# Patient Record
Sex: Male | Born: 1970 | Race: White | Hispanic: No | Marital: Married | Smoking: Never smoker
Health system: Southern US, Community
[De-identification: ages and names within clinical notes are randomized; demographics above are authoritative.]

---

## 2009-03-05 ENCOUNTER — Emergency Department: Payer: Self-pay | Admitting: Emergency Medicine

## 2012-02-06 ENCOUNTER — Ambulatory Visit: Payer: Self-pay | Admitting: Internal Medicine

## 2013-09-30 IMAGING — CR RIGHT INDEX FINGER 2+V
1 series · 3 of 3 positions shown · non-contrast
Comparison: none

REASON FOR EXAM: injury to rt index finger
COMMENTS:

[Series 1: pa · 0.17mm/px · 3 of 3 slices shown]
[im 1/3]
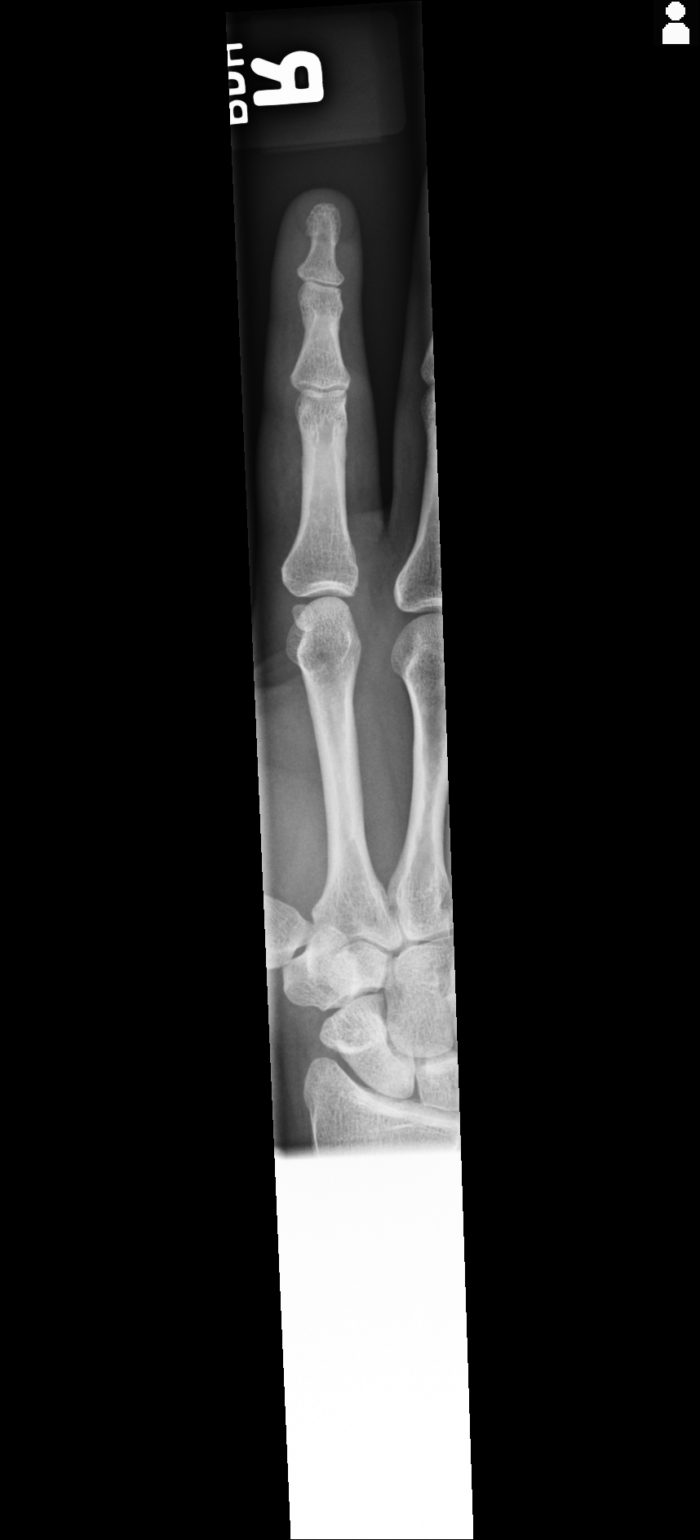
[im 2/3]
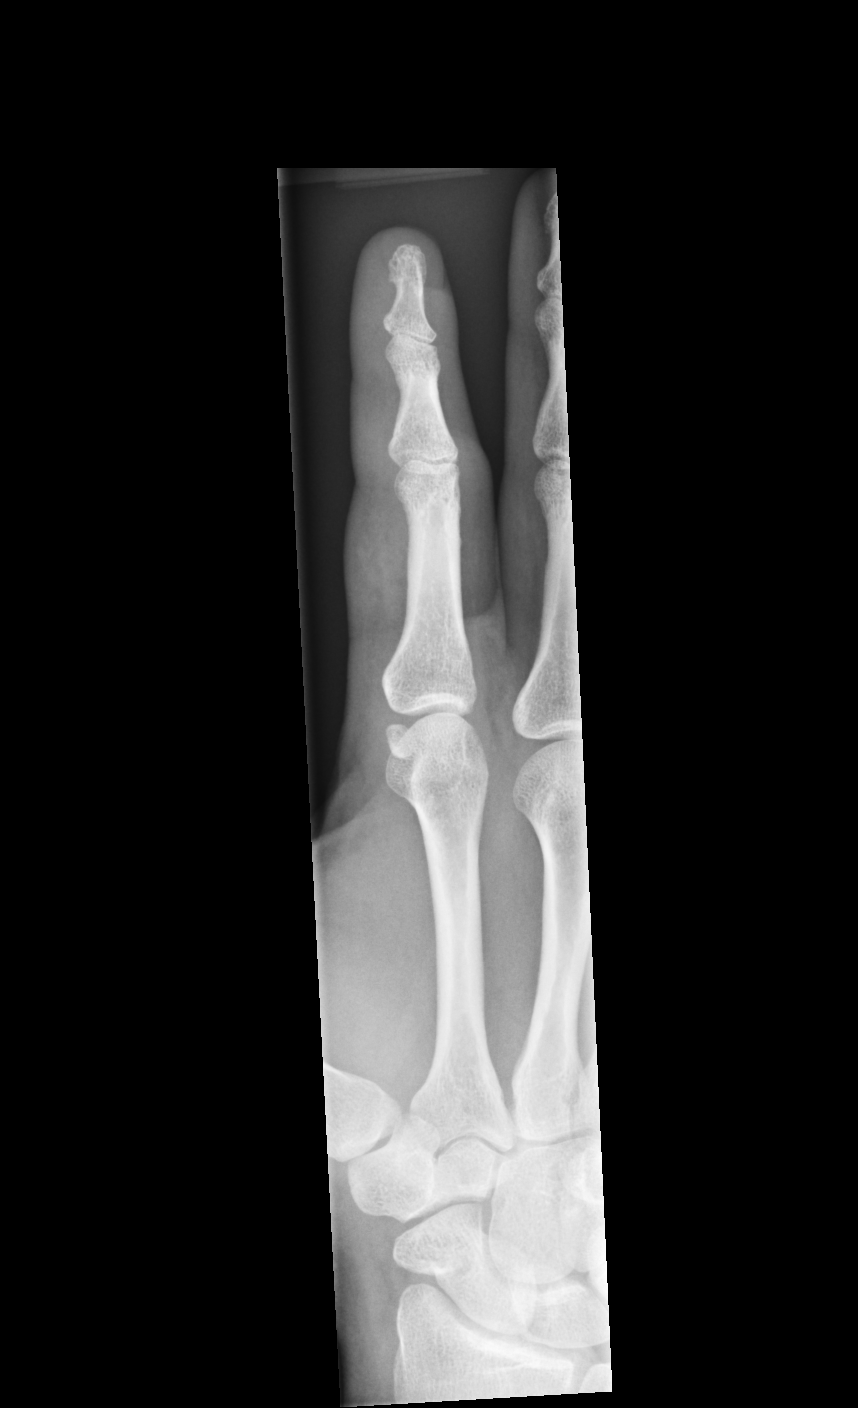
[im 3/3]
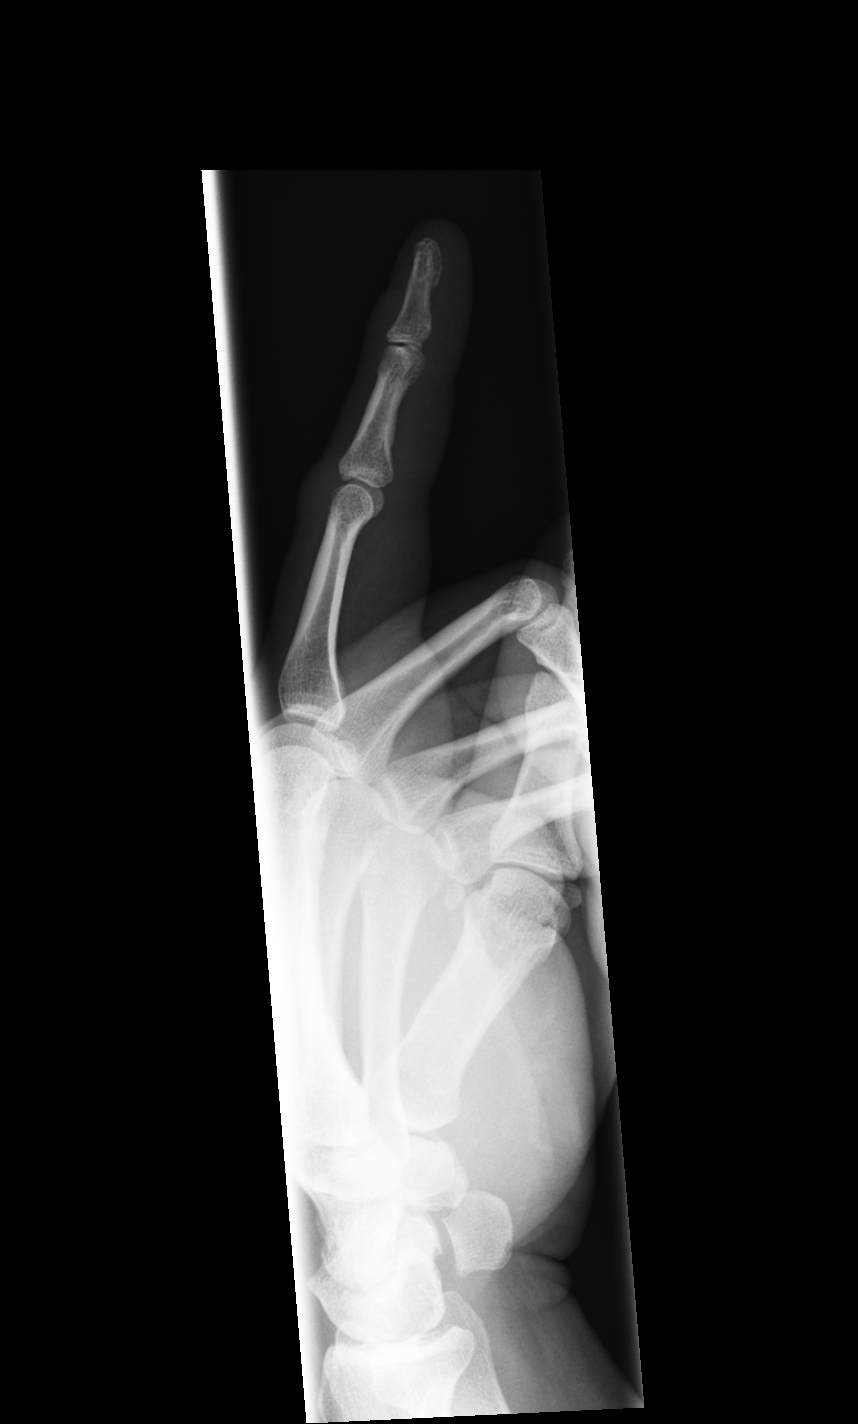

[3 of 3 positions shown; findings below may reference images not displayed]

PROCEDURE:     DXR - DXR FINGER INDEX 2ND DIGIT RT HA  - February 06, 2012  [DATE]

RESULT:     Three views of the right index finger are submitted. The bones
appear adequately mineralized. I do not see evidence of an avulsion fracture
or other fracture. The interphalangeal joints appear preserved. There is
mild soft tissue swelling over the PIP joint.
IMPRESSION: I see no acute bony abnormality of the right index finger.
Followup imaging is recommended if patient's symptoms do not resolve in a
fashion consistent with an uncomplicated sprain or contusion.

[REDACTED]

## 2015-03-26 ENCOUNTER — Emergency Department: Payer: Federal, State, Local not specified - PPO

## 2015-03-26 ENCOUNTER — Emergency Department
Admission: EM | Admit: 2015-03-26 | Discharge: 2015-03-26 | Disposition: A | Discharge status: Home / Self Care | Payer: Federal, State, Local not specified - PPO | Attending: Emergency Medicine | Admitting: Emergency Medicine

## 2015-03-26 ENCOUNTER — Encounter: Payer: Self-pay | Admitting: Emergency Medicine

## 2015-03-26 DIAGNOSIS — Y998 Other external cause status: Secondary | ICD-10-CM | POA: Insufficient documentation

## 2015-03-26 DIAGNOSIS — S39012A Strain of muscle, fascia and tendon of lower back, initial encounter: Secondary | ICD-10-CM | POA: Diagnosis not present

## 2015-03-26 DIAGNOSIS — W228XXA Striking against or struck by other objects, initial encounter: Secondary | ICD-10-CM | POA: Insufficient documentation

## 2015-03-26 DIAGNOSIS — T1490XA Injury, unspecified, initial encounter: Secondary | ICD-10-CM

## 2015-03-26 DIAGNOSIS — Y92830 Public park as the place of occurrence of the external cause: Secondary | ICD-10-CM | POA: Insufficient documentation

## 2015-03-26 DIAGNOSIS — Y9344 Activity, trampolining: Secondary | ICD-10-CM | POA: Diagnosis not present

## 2015-03-26 DIAGNOSIS — S3992XA Unspecified injury of lower back, initial encounter: Secondary | ICD-10-CM | POA: Diagnosis present

## 2015-03-26 MED ORDER — HYDROCODONE-ACETAMINOPHEN 5-325 MG PO TABS
1.0000 | ORAL_TABLET | Freq: Once | ORAL | Status: AC
  Administered 2015-03-26: 1 via ORAL
  Filled 2015-03-26: qty 1

## 2015-03-26 MED ORDER — CYCLOBENZAPRINE HCL 10 MG PO TABS: 10.0000 mg | ORAL_TABLET | Freq: Three times a day (TID) | ORAL | Status: DC | PRN

## 2015-03-26 MED ORDER — NAPROXEN 500 MG PO TABS
500.0000 mg | ORAL_TABLET | Freq: Two times a day (BID) | ORAL | Status: AC
Start: 2015-03-26 — End: 2016-03-25

## 2015-03-26 NOTE — ED Provider Notes (Signed)
[facility] Emergency Department Provider Note ____________________________________________  Time seen: Approximately 9:14 PM  I have reviewed the triage vital signs and the nursing notes.   HISTORY  Chief Complaint Back Pain   HPI John Bowers is a 44 y.o. male who presents to the emergency department for evaluation of right lower back pain after an injury he received at the trampoline park today. He states that he missed the next trampoline and hit his back on the padded bar. He has taken Aleve with some relief. He denies hematuria since the incident. He denies other injury.Injury occurred at approximately 4 PM.   History reviewed. No pertinent past medical history.  There are no active problems to display for this patient.   History reviewed. No pertinent past surgical history.  Current Outpatient Rx  Name  Route  Sig  Dispense  Refill  . cyclobenzaprine (FLEXERIL) 10 MG tablet   Oral   Take 1 tablet (10 mg total) by mouth 3 (three) times daily as needed for muscle spasms.   30 tablet   0   . naproxen (NAPROSYN) 500 MG tablet   Oral   Take 1 tablet (500 mg total) by mouth 2 (two) times daily with a meal.   60 tablet   2     Allergies Review of patient's allergies indicates no known allergies.  No family history on file.  Social History History  Substance Use Topics  . Smoking status: Never Smoker   . Smokeless tobacco: Not on file  . Alcohol Use: No    Review of Systems Constitutional: No recent illness. Eyes: No visual changes. ENT: No sore throat. Cardiovascular: Denies chest pain or palpitations. Respiratory: Denies shortness of breath. Gastrointestinal: No abdominal pain.  Genitourinary: Negative for dysuria. Musculoskeletal: Pain in right lower back. Skin: Negative for rash. Neurological: Negative for headaches, focal weakness or numbness. 10-point ROS otherwise  negative.  ____________________________________________   PHYSICAL EXAM:  VITAL SIGNS: ED Triage Vitals  Enc Vitals Group     BP 03/26/15 1931 142/91 mmHg     Pulse Rate 03/26/15 1931 92     Resp 03/26/15 1931 20     Temp 03/26/15 1931 98.2 F (36.8 C)     Temp Source 03/26/15 1931 Oral     SpO2 03/26/15 1931 98 %     Weight 03/26/15 1931 195 lb (88.451 kg)     Height 03/26/15 1931 5\' 10"  (1.778 m)     Head Cir --      Peak Flow --      Pain Score 03/26/15 1931 5     Pain Loc --      Pain Edu? --      Excl. in GC? --     Constitutional: Alert and oriented. Well appearing and in no acute distress. Eyes: Conjunctivae are normal. EOMI. Head: Atraumatic. Nose: No congestion/rhinnorhea. Neck: No stridor.  Respiratory: Normal respiratory effort.   Musculoskeletal: Midline tenderness over the lower lumbar spine with paraspinal tenderness over the right side. Neurologic:  Normal speech and language. No gross focal neurologic deficits are appreciated. Speech is normal. No gait instability. Skin:  Skin is warm, dry and intact. Atraumatic. No contusion noted Psychiatric: Mood and affect are normal. Speech and behavior are normal.  ____________________________________________   LABS (all labs ordered are listed, but only abnormal results are displayed)  Labs Reviewed - No data to display ____________________________________________  RADIOLOGY  Negative for acute bony abnormality. ____________________________________________   PROCEDURES  Procedure(s)  performed: None   ____________________________________________   INITIAL IMPRESSION / ASSESSMENT AND PLAN / ED COURSE  Pertinent labs & imaging results that were available during my care of the patient were reviewed by me and considered in my medical decision making (see chart for details).  Patient was advised to follow-up with orthopedics for symptoms that are not improving over the next week. He was advised to use  ice and elevate his legs while at home. He was advised to take the medications as prescribed. He was advised to return to the emergency department for symptoms that change or worsen if he is unable to schedule an appointment with orthopedics or his primary care provider. ____________________________________________   FINAL CLINICAL IMPRESSION(S) / ED DIAGNOSES  Final diagnoses:  Injury  Lumbosacral strain, initial encounter      Chinita Pester, FNP 03/26/15 2200  Minna Antis, MD 03/26/15 2229

## 2015-03-26 NOTE — Discharge Instructions (Signed)
Follow-up with the primary care provider or orthopedic specialist for symptoms that aren't not improving over the week. Return to the emergency department for symptoms that change or worsen if he is unable to schedule an appointment. Use ice on the sore areas and elevate the lower extremities.

## 2015-03-26 NOTE — ED Notes (Signed)
Patient ambulatory to triage with steady gait, without difficulty or distress noted; pt reports injuring lower back at trampoline park today at 4pm

## 2016-01-07 DIAGNOSIS — K080 Exfoliation of teeth due to systemic causes: Secondary | ICD-10-CM | POA: Diagnosis not present

## 2016-02-21 DIAGNOSIS — Z13220 Encounter for screening for lipoid disorders: Secondary | ICD-10-CM | POA: Diagnosis not present

## 2016-02-21 DIAGNOSIS — Z0000 Encounter for general adult medical examination without abnormal findings: Secondary | ICD-10-CM | POA: Diagnosis not present

## 2016-06-24 DIAGNOSIS — D2261 Melanocytic nevi of right upper limb, including shoulder: Secondary | ICD-10-CM | POA: Diagnosis not present

## 2016-06-24 DIAGNOSIS — L719 Rosacea, unspecified: Secondary | ICD-10-CM | POA: Diagnosis not present

## 2016-06-24 DIAGNOSIS — D485 Neoplasm of uncertain behavior of skin: Secondary | ICD-10-CM | POA: Diagnosis not present

## 2016-06-24 DIAGNOSIS — Z85828 Personal history of other malignant neoplasm of skin: Secondary | ICD-10-CM | POA: Diagnosis not present

## 2016-06-24 DIAGNOSIS — D225 Melanocytic nevi of trunk: Secondary | ICD-10-CM | POA: Diagnosis not present

## 2016-06-24 DIAGNOSIS — D2262 Melanocytic nevi of left upper limb, including shoulder: Secondary | ICD-10-CM | POA: Diagnosis not present

## 2016-09-16 DIAGNOSIS — K080 Exfoliation of teeth due to systemic causes: Secondary | ICD-10-CM | POA: Diagnosis not present

## 2016-09-23 DIAGNOSIS — K080 Exfoliation of teeth due to systemic causes: Secondary | ICD-10-CM | POA: Diagnosis not present

## 2016-11-17 IMAGING — CR DG LUMBAR SPINE 2-3V
1 series · 3 of 3 positions shown · non-contrast
Comparison: None.

CLINICAL DATA: Status post fall off trampoline, with right lower
back pain. Initial encounter.

EXAM:
LUMBAR SPINE - 2-3 VIEW

[Series 1: dg lumbar spine 2-3 views · 0.14mm/px · 3 of 3 slices shown]
[im 1/3]
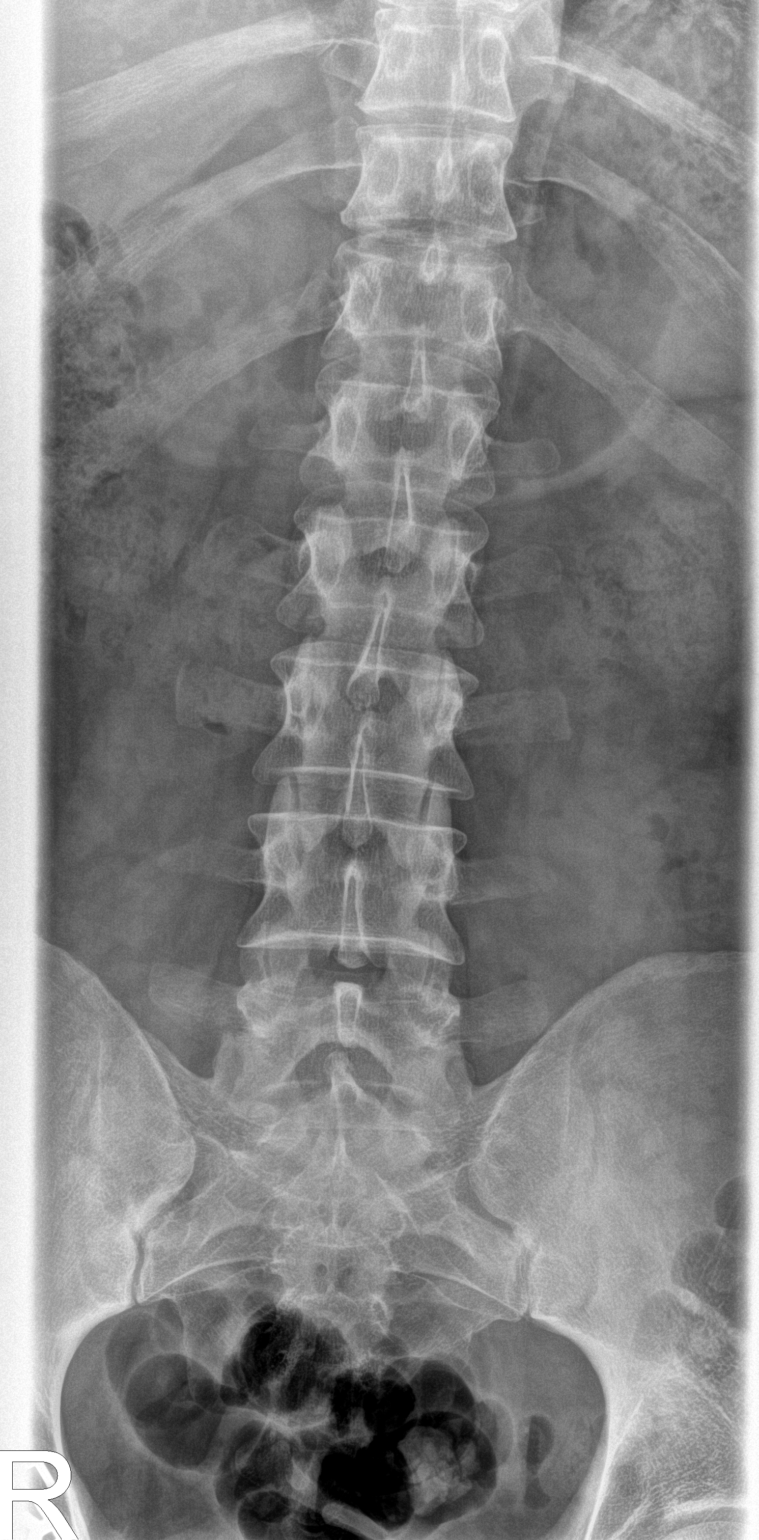
[im 2/3]
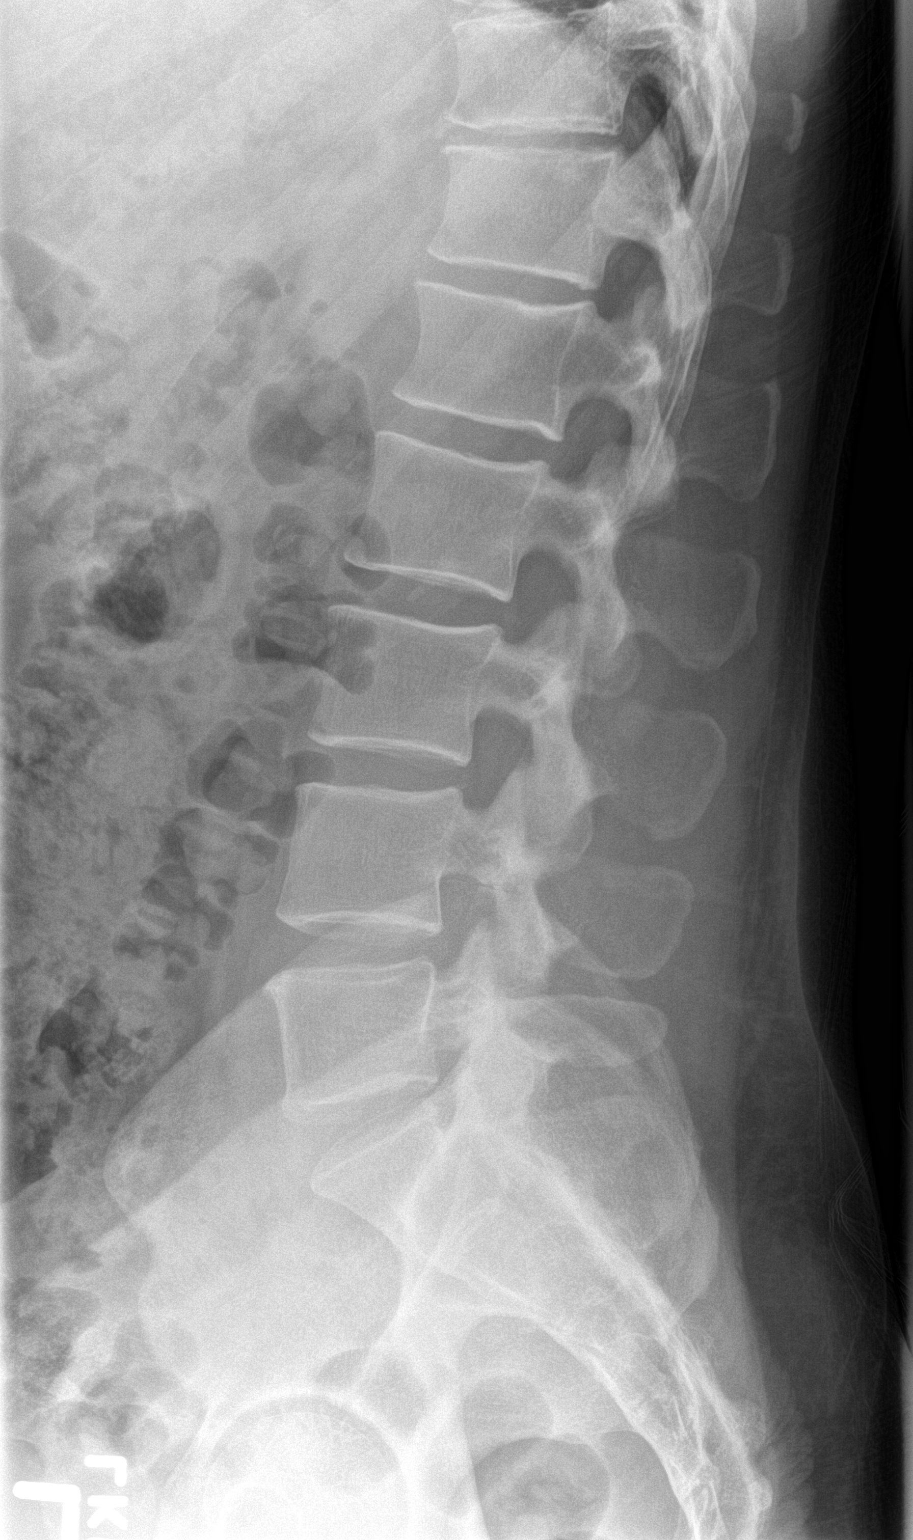
[im 3/3]
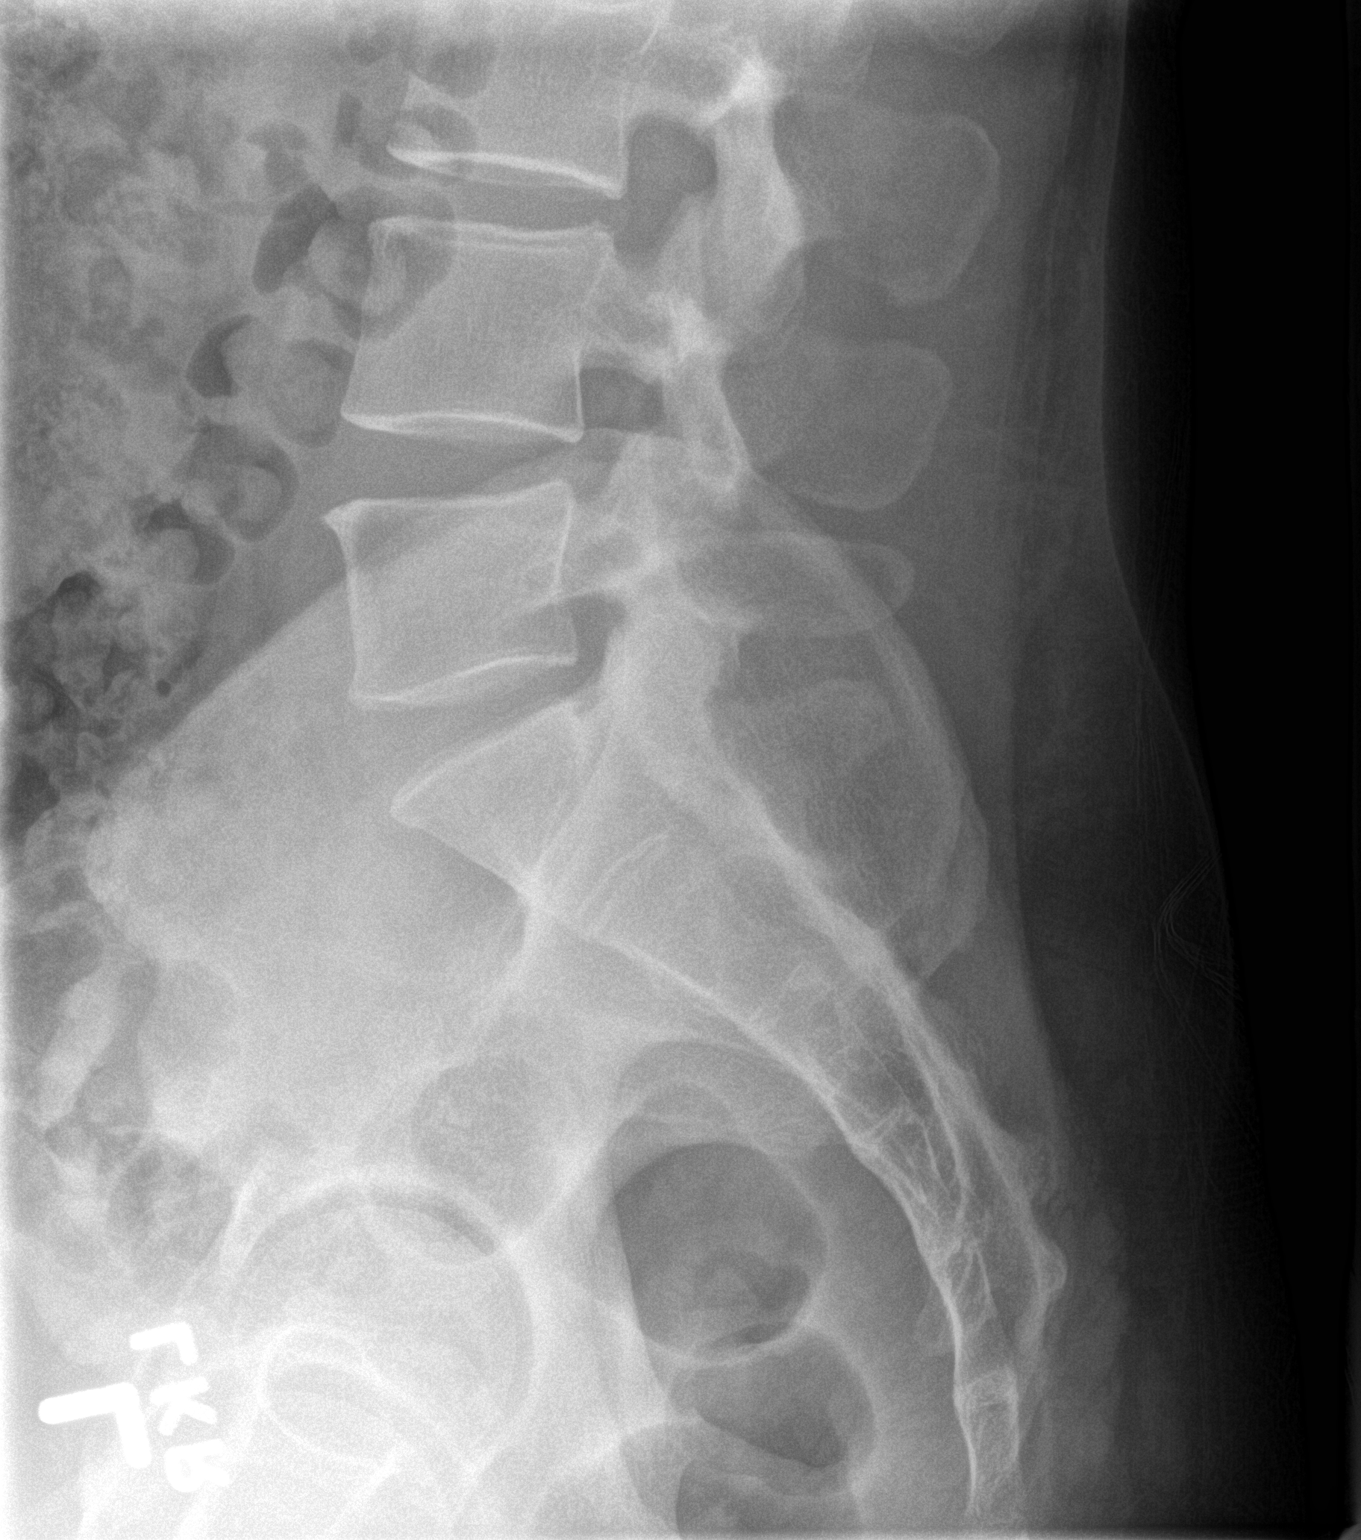

[3 of 3 positions shown; findings below may reference images not displayed]

FINDINGS: There is no evidence of fracture or subluxation. Vertebral bodies
demonstrate normal height and alignment. Intervertebral disc spaces
are preserved. The visualized neural foramina are grossly
unremarkable in appearance.

The visualized bowel gas pattern is unremarkable in appearance; air
and stool are noted within the colon. The sacroiliac joints are
within normal limits.
IMPRESSION: No evidence of fracture or subluxation along the lumbar spine.

## 2017-03-17 DIAGNOSIS — Z13220 Encounter for screening for lipoid disorders: Secondary | ICD-10-CM | POA: Diagnosis not present

## 2017-03-17 DIAGNOSIS — Z0000 Encounter for general adult medical examination without abnormal findings: Secondary | ICD-10-CM | POA: Diagnosis not present

## 2017-12-26 DIAGNOSIS — S8991XA Unspecified injury of right lower leg, initial encounter: Secondary | ICD-10-CM | POA: Diagnosis not present

## 2017-12-26 DIAGNOSIS — S8011XA Contusion of right lower leg, initial encounter: Secondary | ICD-10-CM | POA: Diagnosis not present

## 2018-05-03 DIAGNOSIS — E7800 Pure hypercholesterolemia, unspecified: Secondary | ICD-10-CM | POA: Diagnosis not present

## 2018-05-04 DIAGNOSIS — Z23 Encounter for immunization: Secondary | ICD-10-CM | POA: Diagnosis not present

## 2018-05-04 DIAGNOSIS — Z0000 Encounter for general adult medical examination without abnormal findings: Secondary | ICD-10-CM | POA: Diagnosis not present

## 2018-09-21 DIAGNOSIS — L718 Other rosacea: Secondary | ICD-10-CM | POA: Diagnosis not present

## 2018-09-21 DIAGNOSIS — Z85828 Personal history of other malignant neoplasm of skin: Secondary | ICD-10-CM | POA: Diagnosis not present

## 2018-09-21 DIAGNOSIS — D2262 Melanocytic nevi of left upper limb, including shoulder: Secondary | ICD-10-CM | POA: Diagnosis not present

## 2018-09-21 DIAGNOSIS — D2261 Melanocytic nevi of right upper limb, including shoulder: Secondary | ICD-10-CM | POA: Diagnosis not present

## 2018-10-13 DIAGNOSIS — M722 Plantar fascial fibromatosis: Secondary | ICD-10-CM | POA: Diagnosis not present

## 2018-10-13 DIAGNOSIS — M7661 Achilles tendinitis, right leg: Secondary | ICD-10-CM | POA: Diagnosis not present

## 2018-10-13 DIAGNOSIS — M79671 Pain in right foot: Secondary | ICD-10-CM | POA: Diagnosis not present

## 2018-11-30 DIAGNOSIS — M9902 Segmental and somatic dysfunction of thoracic region: Secondary | ICD-10-CM | POA: Diagnosis not present

## 2018-11-30 DIAGNOSIS — M62830 Muscle spasm of back: Secondary | ICD-10-CM | POA: Diagnosis not present

## 2018-11-30 DIAGNOSIS — M9901 Segmental and somatic dysfunction of cervical region: Secondary | ICD-10-CM | POA: Diagnosis not present

## 2018-11-30 DIAGNOSIS — M5412 Radiculopathy, cervical region: Secondary | ICD-10-CM | POA: Diagnosis not present

## 2018-12-01 DIAGNOSIS — M9902 Segmental and somatic dysfunction of thoracic region: Secondary | ICD-10-CM | POA: Diagnosis not present

## 2018-12-01 DIAGNOSIS — M9901 Segmental and somatic dysfunction of cervical region: Secondary | ICD-10-CM | POA: Diagnosis not present

## 2018-12-01 DIAGNOSIS — M5412 Radiculopathy, cervical region: Secondary | ICD-10-CM | POA: Diagnosis not present

## 2018-12-01 DIAGNOSIS — M62830 Muscle spasm of back: Secondary | ICD-10-CM | POA: Diagnosis not present

## 2018-12-05 DIAGNOSIS — M9901 Segmental and somatic dysfunction of cervical region: Secondary | ICD-10-CM | POA: Diagnosis not present

## 2018-12-05 DIAGNOSIS — M9902 Segmental and somatic dysfunction of thoracic region: Secondary | ICD-10-CM | POA: Diagnosis not present

## 2018-12-05 DIAGNOSIS — M62830 Muscle spasm of back: Secondary | ICD-10-CM | POA: Diagnosis not present

## 2018-12-05 DIAGNOSIS — M5412 Radiculopathy, cervical region: Secondary | ICD-10-CM | POA: Diagnosis not present

## 2018-12-07 DIAGNOSIS — M9901 Segmental and somatic dysfunction of cervical region: Secondary | ICD-10-CM | POA: Diagnosis not present

## 2018-12-07 DIAGNOSIS — M62830 Muscle spasm of back: Secondary | ICD-10-CM | POA: Diagnosis not present

## 2018-12-07 DIAGNOSIS — M9902 Segmental and somatic dysfunction of thoracic region: Secondary | ICD-10-CM | POA: Diagnosis not present

## 2018-12-07 DIAGNOSIS — M5412 Radiculopathy, cervical region: Secondary | ICD-10-CM | POA: Diagnosis not present

## 2018-12-08 DIAGNOSIS — M9902 Segmental and somatic dysfunction of thoracic region: Secondary | ICD-10-CM | POA: Diagnosis not present

## 2018-12-08 DIAGNOSIS — M9901 Segmental and somatic dysfunction of cervical region: Secondary | ICD-10-CM | POA: Diagnosis not present

## 2018-12-08 DIAGNOSIS — M5412 Radiculopathy, cervical region: Secondary | ICD-10-CM | POA: Diagnosis not present

## 2018-12-08 DIAGNOSIS — M62830 Muscle spasm of back: Secondary | ICD-10-CM | POA: Diagnosis not present

## 2018-12-12 DIAGNOSIS — M62830 Muscle spasm of back: Secondary | ICD-10-CM | POA: Diagnosis not present

## 2018-12-12 DIAGNOSIS — M9902 Segmental and somatic dysfunction of thoracic region: Secondary | ICD-10-CM | POA: Diagnosis not present

## 2018-12-12 DIAGNOSIS — M5412 Radiculopathy, cervical region: Secondary | ICD-10-CM | POA: Diagnosis not present

## 2018-12-12 DIAGNOSIS — M9901 Segmental and somatic dysfunction of cervical region: Secondary | ICD-10-CM | POA: Diagnosis not present

## 2018-12-14 DIAGNOSIS — M9901 Segmental and somatic dysfunction of cervical region: Secondary | ICD-10-CM | POA: Diagnosis not present

## 2018-12-14 DIAGNOSIS — M9902 Segmental and somatic dysfunction of thoracic region: Secondary | ICD-10-CM | POA: Diagnosis not present

## 2018-12-14 DIAGNOSIS — M5412 Radiculopathy, cervical region: Secondary | ICD-10-CM | POA: Diagnosis not present

## 2018-12-14 DIAGNOSIS — M62830 Muscle spasm of back: Secondary | ICD-10-CM | POA: Diagnosis not present

## 2018-12-15 DIAGNOSIS — M62830 Muscle spasm of back: Secondary | ICD-10-CM | POA: Diagnosis not present

## 2018-12-15 DIAGNOSIS — M9902 Segmental and somatic dysfunction of thoracic region: Secondary | ICD-10-CM | POA: Diagnosis not present

## 2018-12-15 DIAGNOSIS — M9901 Segmental and somatic dysfunction of cervical region: Secondary | ICD-10-CM | POA: Diagnosis not present

## 2018-12-15 DIAGNOSIS — M5412 Radiculopathy, cervical region: Secondary | ICD-10-CM | POA: Diagnosis not present

## 2018-12-19 DIAGNOSIS — M62830 Muscle spasm of back: Secondary | ICD-10-CM | POA: Diagnosis not present

## 2018-12-19 DIAGNOSIS — M9902 Segmental and somatic dysfunction of thoracic region: Secondary | ICD-10-CM | POA: Diagnosis not present

## 2018-12-19 DIAGNOSIS — M9901 Segmental and somatic dysfunction of cervical region: Secondary | ICD-10-CM | POA: Diagnosis not present

## 2018-12-19 DIAGNOSIS — M5412 Radiculopathy, cervical region: Secondary | ICD-10-CM | POA: Diagnosis not present

## 2018-12-21 DIAGNOSIS — M5412 Radiculopathy, cervical region: Secondary | ICD-10-CM | POA: Diagnosis not present

## 2018-12-21 DIAGNOSIS — M9901 Segmental and somatic dysfunction of cervical region: Secondary | ICD-10-CM | POA: Diagnosis not present

## 2018-12-21 DIAGNOSIS — M9902 Segmental and somatic dysfunction of thoracic region: Secondary | ICD-10-CM | POA: Diagnosis not present

## 2018-12-21 DIAGNOSIS — M62830 Muscle spasm of back: Secondary | ICD-10-CM | POA: Diagnosis not present

## 2018-12-22 DIAGNOSIS — M9901 Segmental and somatic dysfunction of cervical region: Secondary | ICD-10-CM | POA: Diagnosis not present

## 2018-12-22 DIAGNOSIS — M5412 Radiculopathy, cervical region: Secondary | ICD-10-CM | POA: Diagnosis not present

## 2018-12-22 DIAGNOSIS — M9902 Segmental and somatic dysfunction of thoracic region: Secondary | ICD-10-CM | POA: Diagnosis not present

## 2018-12-22 DIAGNOSIS — M62830 Muscle spasm of back: Secondary | ICD-10-CM | POA: Diagnosis not present

## 2018-12-27 DIAGNOSIS — M9902 Segmental and somatic dysfunction of thoracic region: Secondary | ICD-10-CM | POA: Diagnosis not present

## 2018-12-27 DIAGNOSIS — M62830 Muscle spasm of back: Secondary | ICD-10-CM | POA: Diagnosis not present

## 2018-12-27 DIAGNOSIS — M9901 Segmental and somatic dysfunction of cervical region: Secondary | ICD-10-CM | POA: Diagnosis not present

## 2018-12-27 DIAGNOSIS — M5412 Radiculopathy, cervical region: Secondary | ICD-10-CM | POA: Diagnosis not present

## 2018-12-29 DIAGNOSIS — M9901 Segmental and somatic dysfunction of cervical region: Secondary | ICD-10-CM | POA: Diagnosis not present

## 2018-12-29 DIAGNOSIS — M62830 Muscle spasm of back: Secondary | ICD-10-CM | POA: Diagnosis not present

## 2018-12-29 DIAGNOSIS — M9902 Segmental and somatic dysfunction of thoracic region: Secondary | ICD-10-CM | POA: Diagnosis not present

## 2018-12-29 DIAGNOSIS — M5412 Radiculopathy, cervical region: Secondary | ICD-10-CM | POA: Diagnosis not present

## 2019-01-03 DIAGNOSIS — M62830 Muscle spasm of back: Secondary | ICD-10-CM | POA: Diagnosis not present

## 2019-01-03 DIAGNOSIS — M9902 Segmental and somatic dysfunction of thoracic region: Secondary | ICD-10-CM | POA: Diagnosis not present

## 2019-01-03 DIAGNOSIS — M9901 Segmental and somatic dysfunction of cervical region: Secondary | ICD-10-CM | POA: Diagnosis not present

## 2019-01-03 DIAGNOSIS — M5412 Radiculopathy, cervical region: Secondary | ICD-10-CM | POA: Diagnosis not present

## 2019-01-05 DIAGNOSIS — M62830 Muscle spasm of back: Secondary | ICD-10-CM | POA: Diagnosis not present

## 2019-01-05 DIAGNOSIS — M5412 Radiculopathy, cervical region: Secondary | ICD-10-CM | POA: Diagnosis not present

## 2019-01-05 DIAGNOSIS — M9901 Segmental and somatic dysfunction of cervical region: Secondary | ICD-10-CM | POA: Diagnosis not present

## 2019-01-05 DIAGNOSIS — M9902 Segmental and somatic dysfunction of thoracic region: Secondary | ICD-10-CM | POA: Diagnosis not present

## 2019-01-10 DIAGNOSIS — M62830 Muscle spasm of back: Secondary | ICD-10-CM | POA: Diagnosis not present

## 2019-01-10 DIAGNOSIS — M9902 Segmental and somatic dysfunction of thoracic region: Secondary | ICD-10-CM | POA: Diagnosis not present

## 2019-01-10 DIAGNOSIS — M9901 Segmental and somatic dysfunction of cervical region: Secondary | ICD-10-CM | POA: Diagnosis not present

## 2019-01-10 DIAGNOSIS — M5412 Radiculopathy, cervical region: Secondary | ICD-10-CM | POA: Diagnosis not present

## 2019-01-12 DIAGNOSIS — M9902 Segmental and somatic dysfunction of thoracic region: Secondary | ICD-10-CM | POA: Diagnosis not present

## 2019-01-12 DIAGNOSIS — M62830 Muscle spasm of back: Secondary | ICD-10-CM | POA: Diagnosis not present

## 2019-01-12 DIAGNOSIS — M9901 Segmental and somatic dysfunction of cervical region: Secondary | ICD-10-CM | POA: Diagnosis not present

## 2019-01-12 DIAGNOSIS — M5412 Radiculopathy, cervical region: Secondary | ICD-10-CM | POA: Diagnosis not present

## 2019-01-18 DIAGNOSIS — M5412 Radiculopathy, cervical region: Secondary | ICD-10-CM | POA: Diagnosis not present

## 2019-01-18 DIAGNOSIS — M62830 Muscle spasm of back: Secondary | ICD-10-CM | POA: Diagnosis not present

## 2019-01-18 DIAGNOSIS — M9901 Segmental and somatic dysfunction of cervical region: Secondary | ICD-10-CM | POA: Diagnosis not present

## 2019-01-18 DIAGNOSIS — M9902 Segmental and somatic dysfunction of thoracic region: Secondary | ICD-10-CM | POA: Diagnosis not present

## 2019-01-25 DIAGNOSIS — M9901 Segmental and somatic dysfunction of cervical region: Secondary | ICD-10-CM | POA: Diagnosis not present

## 2019-01-25 DIAGNOSIS — M9902 Segmental and somatic dysfunction of thoracic region: Secondary | ICD-10-CM | POA: Diagnosis not present

## 2019-01-25 DIAGNOSIS — M62830 Muscle spasm of back: Secondary | ICD-10-CM | POA: Diagnosis not present

## 2019-01-25 DIAGNOSIS — M5412 Radiculopathy, cervical region: Secondary | ICD-10-CM | POA: Diagnosis not present

## 2019-02-08 DIAGNOSIS — M5412 Radiculopathy, cervical region: Secondary | ICD-10-CM | POA: Diagnosis not present

## 2019-02-08 DIAGNOSIS — M9901 Segmental and somatic dysfunction of cervical region: Secondary | ICD-10-CM | POA: Diagnosis not present

## 2019-02-08 DIAGNOSIS — M62830 Muscle spasm of back: Secondary | ICD-10-CM | POA: Diagnosis not present

## 2019-02-08 DIAGNOSIS — M9902 Segmental and somatic dysfunction of thoracic region: Secondary | ICD-10-CM | POA: Diagnosis not present

## 2019-05-15 DIAGNOSIS — Z20828 Contact with and (suspected) exposure to other viral communicable diseases: Secondary | ICD-10-CM | POA: Diagnosis not present

## 2019-09-15 DIAGNOSIS — Z20828 Contact with and (suspected) exposure to other viral communicable diseases: Secondary | ICD-10-CM | POA: Diagnosis not present

## 2019-09-15 DIAGNOSIS — Z03818 Encounter for observation for suspected exposure to other biological agents ruled out: Secondary | ICD-10-CM | POA: Diagnosis not present

## 2019-09-21 DIAGNOSIS — Z20828 Contact with and (suspected) exposure to other viral communicable diseases: Secondary | ICD-10-CM | POA: Diagnosis not present

## 2019-10-11 DIAGNOSIS — B948 Sequelae of other specified infectious and parasitic diseases: Secondary | ICD-10-CM | POA: Diagnosis not present

## 2019-12-13 DIAGNOSIS — S62025A Nondisplaced fracture of middle third of navicular [scaphoid] bone of left wrist, initial encounter for closed fracture: Secondary | ICD-10-CM | POA: Diagnosis not present

## 2019-12-13 DIAGNOSIS — G8929 Other chronic pain: Secondary | ICD-10-CM | POA: Diagnosis not present

## 2019-12-13 DIAGNOSIS — M25532 Pain in left wrist: Secondary | ICD-10-CM | POA: Diagnosis not present

## 2020-01-01 DIAGNOSIS — M5412 Radiculopathy, cervical region: Secondary | ICD-10-CM | POA: Diagnosis not present

## 2020-01-01 DIAGNOSIS — M62830 Muscle spasm of back: Secondary | ICD-10-CM | POA: Diagnosis not present

## 2020-01-01 DIAGNOSIS — M9901 Segmental and somatic dysfunction of cervical region: Secondary | ICD-10-CM | POA: Diagnosis not present

## 2020-01-01 DIAGNOSIS — M9902 Segmental and somatic dysfunction of thoracic region: Secondary | ICD-10-CM | POA: Diagnosis not present

## 2020-05-14 DIAGNOSIS — M62830 Muscle spasm of back: Secondary | ICD-10-CM | POA: Diagnosis not present

## 2020-05-14 DIAGNOSIS — M9901 Segmental and somatic dysfunction of cervical region: Secondary | ICD-10-CM | POA: Diagnosis not present

## 2020-05-14 DIAGNOSIS — M9902 Segmental and somatic dysfunction of thoracic region: Secondary | ICD-10-CM | POA: Diagnosis not present

## 2020-05-14 DIAGNOSIS — M5412 Radiculopathy, cervical region: Secondary | ICD-10-CM | POA: Diagnosis not present

## 2020-05-23 DIAGNOSIS — M9901 Segmental and somatic dysfunction of cervical region: Secondary | ICD-10-CM | POA: Diagnosis not present

## 2020-05-23 DIAGNOSIS — M5412 Radiculopathy, cervical region: Secondary | ICD-10-CM | POA: Diagnosis not present

## 2020-05-23 DIAGNOSIS — M62830 Muscle spasm of back: Secondary | ICD-10-CM | POA: Diagnosis not present

## 2020-05-23 DIAGNOSIS — M9902 Segmental and somatic dysfunction of thoracic region: Secondary | ICD-10-CM | POA: Diagnosis not present

## 2020-05-28 DIAGNOSIS — M9902 Segmental and somatic dysfunction of thoracic region: Secondary | ICD-10-CM | POA: Diagnosis not present

## 2020-05-28 DIAGNOSIS — M62830 Muscle spasm of back: Secondary | ICD-10-CM | POA: Diagnosis not present

## 2020-05-28 DIAGNOSIS — M5412 Radiculopathy, cervical region: Secondary | ICD-10-CM | POA: Diagnosis not present

## 2020-05-28 DIAGNOSIS — M9901 Segmental and somatic dysfunction of cervical region: Secondary | ICD-10-CM | POA: Diagnosis not present

## 2020-06-03 DIAGNOSIS — M9901 Segmental and somatic dysfunction of cervical region: Secondary | ICD-10-CM | POA: Diagnosis not present

## 2020-06-03 DIAGNOSIS — M62830 Muscle spasm of back: Secondary | ICD-10-CM | POA: Diagnosis not present

## 2020-06-03 DIAGNOSIS — M5412 Radiculopathy, cervical region: Secondary | ICD-10-CM | POA: Diagnosis not present

## 2020-06-03 DIAGNOSIS — M9902 Segmental and somatic dysfunction of thoracic region: Secondary | ICD-10-CM | POA: Diagnosis not present

## 2020-06-11 DIAGNOSIS — M9902 Segmental and somatic dysfunction of thoracic region: Secondary | ICD-10-CM | POA: Diagnosis not present

## 2020-06-11 DIAGNOSIS — M5412 Radiculopathy, cervical region: Secondary | ICD-10-CM | POA: Diagnosis not present

## 2020-06-11 DIAGNOSIS — M62830 Muscle spasm of back: Secondary | ICD-10-CM | POA: Diagnosis not present

## 2020-06-11 DIAGNOSIS — M9901 Segmental and somatic dysfunction of cervical region: Secondary | ICD-10-CM | POA: Diagnosis not present

## 2020-06-26 DIAGNOSIS — M9901 Segmental and somatic dysfunction of cervical region: Secondary | ICD-10-CM | POA: Diagnosis not present

## 2020-06-26 DIAGNOSIS — M9902 Segmental and somatic dysfunction of thoracic region: Secondary | ICD-10-CM | POA: Diagnosis not present

## 2020-06-26 DIAGNOSIS — M5412 Radiculopathy, cervical region: Secondary | ICD-10-CM | POA: Diagnosis not present

## 2020-06-26 DIAGNOSIS — M62830 Muscle spasm of back: Secondary | ICD-10-CM | POA: Diagnosis not present

## 2020-07-10 DIAGNOSIS — M9901 Segmental and somatic dysfunction of cervical region: Secondary | ICD-10-CM | POA: Diagnosis not present

## 2020-07-10 DIAGNOSIS — M9902 Segmental and somatic dysfunction of thoracic region: Secondary | ICD-10-CM | POA: Diagnosis not present

## 2020-07-10 DIAGNOSIS — M5412 Radiculopathy, cervical region: Secondary | ICD-10-CM | POA: Diagnosis not present

## 2020-07-10 DIAGNOSIS — M62830 Muscle spasm of back: Secondary | ICD-10-CM | POA: Diagnosis not present

## 2020-08-07 DIAGNOSIS — M9901 Segmental and somatic dysfunction of cervical region: Secondary | ICD-10-CM | POA: Diagnosis not present

## 2020-08-07 DIAGNOSIS — M9902 Segmental and somatic dysfunction of thoracic region: Secondary | ICD-10-CM | POA: Diagnosis not present

## 2020-08-07 DIAGNOSIS — M5412 Radiculopathy, cervical region: Secondary | ICD-10-CM | POA: Diagnosis not present

## 2020-08-07 DIAGNOSIS — M62830 Muscle spasm of back: Secondary | ICD-10-CM | POA: Diagnosis not present

## 2021-02-12 DIAGNOSIS — E559 Vitamin D deficiency, unspecified: Secondary | ICD-10-CM | POA: Diagnosis not present

## 2021-02-12 DIAGNOSIS — Z13220 Encounter for screening for lipoid disorders: Secondary | ICD-10-CM | POA: Diagnosis not present

## 2021-02-12 DIAGNOSIS — Z0000 Encounter for general adult medical examination without abnormal findings: Secondary | ICD-10-CM | POA: Diagnosis not present

## 2021-02-13 DIAGNOSIS — M5412 Radiculopathy, cervical region: Secondary | ICD-10-CM | POA: Diagnosis not present

## 2021-02-13 DIAGNOSIS — M9902 Segmental and somatic dysfunction of thoracic region: Secondary | ICD-10-CM | POA: Diagnosis not present

## 2021-02-13 DIAGNOSIS — M9901 Segmental and somatic dysfunction of cervical region: Secondary | ICD-10-CM | POA: Diagnosis not present

## 2021-02-13 DIAGNOSIS — M62830 Muscle spasm of back: Secondary | ICD-10-CM | POA: Diagnosis not present

## 2021-02-18 DIAGNOSIS — M62830 Muscle spasm of back: Secondary | ICD-10-CM | POA: Diagnosis not present

## 2021-02-18 DIAGNOSIS — M9901 Segmental and somatic dysfunction of cervical region: Secondary | ICD-10-CM | POA: Diagnosis not present

## 2021-02-18 DIAGNOSIS — M5412 Radiculopathy, cervical region: Secondary | ICD-10-CM | POA: Diagnosis not present

## 2021-02-18 DIAGNOSIS — M9902 Segmental and somatic dysfunction of thoracic region: Secondary | ICD-10-CM | POA: Diagnosis not present

## 2021-02-27 DIAGNOSIS — M5412 Radiculopathy, cervical region: Secondary | ICD-10-CM | POA: Diagnosis not present

## 2021-02-27 DIAGNOSIS — M9902 Segmental and somatic dysfunction of thoracic region: Secondary | ICD-10-CM | POA: Diagnosis not present

## 2021-02-27 DIAGNOSIS — M62830 Muscle spasm of back: Secondary | ICD-10-CM | POA: Diagnosis not present

## 2021-02-27 DIAGNOSIS — M9901 Segmental and somatic dysfunction of cervical region: Secondary | ICD-10-CM | POA: Diagnosis not present

## 2021-07-03 DIAGNOSIS — M5412 Radiculopathy, cervical region: Secondary | ICD-10-CM | POA: Diagnosis not present

## 2021-07-03 DIAGNOSIS — M62830 Muscle spasm of back: Secondary | ICD-10-CM | POA: Diagnosis not present

## 2021-07-03 DIAGNOSIS — M9901 Segmental and somatic dysfunction of cervical region: Secondary | ICD-10-CM | POA: Diagnosis not present

## 2021-07-03 DIAGNOSIS — M9902 Segmental and somatic dysfunction of thoracic region: Secondary | ICD-10-CM | POA: Diagnosis not present

## 2021-07-10 DIAGNOSIS — M5412 Radiculopathy, cervical region: Secondary | ICD-10-CM | POA: Diagnosis not present

## 2021-07-10 DIAGNOSIS — M62830 Muscle spasm of back: Secondary | ICD-10-CM | POA: Diagnosis not present

## 2021-07-10 DIAGNOSIS — M9901 Segmental and somatic dysfunction of cervical region: Secondary | ICD-10-CM | POA: Diagnosis not present

## 2021-07-10 DIAGNOSIS — M9902 Segmental and somatic dysfunction of thoracic region: Secondary | ICD-10-CM | POA: Diagnosis not present

## 2021-07-16 DIAGNOSIS — M25511 Pain in right shoulder: Secondary | ICD-10-CM | POA: Diagnosis not present

## 2021-07-16 DIAGNOSIS — M9901 Segmental and somatic dysfunction of cervical region: Secondary | ICD-10-CM | POA: Diagnosis not present

## 2021-07-16 DIAGNOSIS — M9907 Segmental and somatic dysfunction of upper extremity: Secondary | ICD-10-CM | POA: Diagnosis not present

## 2021-07-16 DIAGNOSIS — M5412 Radiculopathy, cervical region: Secondary | ICD-10-CM | POA: Diagnosis not present

## 2021-07-22 DIAGNOSIS — M25511 Pain in right shoulder: Secondary | ICD-10-CM | POA: Diagnosis not present

## 2021-07-22 DIAGNOSIS — M9907 Segmental and somatic dysfunction of upper extremity: Secondary | ICD-10-CM | POA: Diagnosis not present

## 2021-07-22 DIAGNOSIS — M9901 Segmental and somatic dysfunction of cervical region: Secondary | ICD-10-CM | POA: Diagnosis not present

## 2021-07-22 DIAGNOSIS — M5412 Radiculopathy, cervical region: Secondary | ICD-10-CM | POA: Diagnosis not present

## 2021-07-24 DIAGNOSIS — M9907 Segmental and somatic dysfunction of upper extremity: Secondary | ICD-10-CM | POA: Diagnosis not present

## 2021-07-24 DIAGNOSIS — M5412 Radiculopathy, cervical region: Secondary | ICD-10-CM | POA: Diagnosis not present

## 2021-07-24 DIAGNOSIS — M25511 Pain in right shoulder: Secondary | ICD-10-CM | POA: Diagnosis not present

## 2021-07-24 DIAGNOSIS — M9901 Segmental and somatic dysfunction of cervical region: Secondary | ICD-10-CM | POA: Diagnosis not present

## 2021-10-07 DIAGNOSIS — K5730 Diverticulosis of large intestine without perforation or abscess without bleeding: Secondary | ICD-10-CM | POA: Diagnosis not present

## 2021-10-07 DIAGNOSIS — Z1211 Encounter for screening for malignant neoplasm of colon: Secondary | ICD-10-CM | POA: Diagnosis not present

## 2021-10-07 DIAGNOSIS — D122 Benign neoplasm of ascending colon: Secondary | ICD-10-CM | POA: Diagnosis not present

## 2021-10-07 DIAGNOSIS — D123 Benign neoplasm of transverse colon: Secondary | ICD-10-CM | POA: Diagnosis not present

## 2021-10-20 ENCOUNTER — Ambulatory Visit
Admission: EM | Admit: 2021-10-20 | Discharge: 2021-10-20 | Disposition: A | Discharge status: Home / Self Care | Payer: Federal, State, Local not specified - PPO | Attending: Internal Medicine | Admitting: Internal Medicine

## 2021-10-20 ENCOUNTER — Encounter: Payer: Self-pay | Admitting: Emergency Medicine

## 2021-10-20 ENCOUNTER — Other Ambulatory Visit: Payer: Self-pay

## 2021-10-20 DIAGNOSIS — J209 Acute bronchitis, unspecified: Secondary | ICD-10-CM

## 2021-10-20 MED ORDER — ALBUTEROL SULFATE (2.5 MG/3ML) 0.083% IN NEBU
2.5000 mg | INHALATION_SOLUTION | Freq: Once | RESPIRATORY_TRACT | Status: AC
  Administered 2021-10-20: 2.5 mg via RESPIRATORY_TRACT

## 2021-10-20 MED ORDER — AZITHROMYCIN 250 MG PO TABS: ORAL_TABLET | ORAL | 0 refills | Status: DC

## 2021-10-20 MED ORDER — ALBUTEROL SULFATE HFA 108 (90 BASE) MCG/ACT IN AERS: 2.0000 | INHALATION_SPRAY | RESPIRATORY_TRACT | 0 refills | Status: AC | PRN

## 2021-10-20 NOTE — ED Triage Notes (Signed)
Pt here with cough, congestion, and headache x 1 week.

## 2021-10-20 NOTE — ED Provider Notes (Signed)
John Bowers    CSN: [id number] Arrival date & time: 10/20/21  1002      History   Chief Complaint Chief Complaint  Patient presents with   Cough   Nasal Congestion   Headache    HPI John Bowers is a 51 y.o. male who presents with scratchy throat, rhinitis, sneezing, and cough one week ago. Denies a fever. Denies HA. His wife has diagnosed with bacterial bronchitis and placed on Amocicillin He has had a producgive cough and been wheezing. Denies being a smoker.     History reviewed. No pertinent past medical history.  There are no problems to display for this patient.   History reviewed. No pertinent surgical history.     Home Medications    Prior to Admission medications   Medication Sig Start Date End Date Taking? Authorizing Provider  cyclobenzaprine (FLEXERIL) 10 MG tablet Take 1 tablet (10 mg total) by mouth 3 (three) times daily as needed for muscle spasms. 03/26/15   Chinita Pester, FNP    Family History History reviewed. No pertinent family history.  Social History Social History   Tobacco Use   Smoking status: Never  Substance Use Topics   Alcohol use: No   Drug use: Never     Allergies   Patient has no known allergies.   Review of Systems Review of Systems  Constitutional:  Positive for fatigue. Negative for chills, diaphoresis and fever.  HENT:  Positive for congestion, postnasal drip and rhinorrhea. Negative for sore throat and trouble swallowing.   Eyes:  Negative for discharge.  Respiratory:  Positive for cough and wheezing. Negative for shortness of breath.   Musculoskeletal:  Negative for myalgias.  Skin:  Negative for rash.  Neurological:  Negative for headaches.    Physical Exam Triage Vital Signs ED Triage Vitals  Enc Vitals Group     BP 10/20/21 1014 (!) 151/80     Pulse Rate 10/20/21 1014 83     Resp 10/20/21 1014 20     Temp 10/20/21 1014 98.9 F (37.2 C)     Temp Source 10/20/21 1014 Oral     SpO2  10/20/21 1014 98 %     Weight --      Height --      Head Circumference --      Peak Flow --      Pain Score 10/20/21 1015 0     Pain Loc --      Pain Edu? --      Excl. in GC? --    No data found.  Updated Vital Signs BP (!) 151/80    Pulse 83    Temp 98.9 F (37.2 C) (Oral)    Resp 20    SpO2 98%   Visual Acuity Right Eye Distance:   Left Eye Distance:   Bilateral Distance:    Right Eye Near:   Left Eye Near:    Bilateral Near:     Physical Exam Physical Exam Constitutional:      General: He is not in acute distress.    Appearance: He is not toxic-appearing.  HENT:     Head: Normocephalic.     Right Ear: Tympanic membrane, ear canal and external ear normal.     Left Ear: Ear canal and external ear normal.     Nose: Nose normal.     Mouth/Throat:     Mouth: Mucous membranes are moist.     Pharynx: Oropharynx is clear.  Eyes:  General: No scleral icterus.    Conjunctiva/sclera: Conjunctivae normal.  Cardiovascular:     Rate and Rhythm: Normal rate and regular rhythm.     Heart sounds: No murmur heard.   Pulmonary:     Effort: Pulmonary effort is normal. No respiratory distress.     Breath sounds: Wheezing present all over     Comments: Has auditory wheezing Musculoskeletal:        General: Normal range of motion.     Cervical back: Neck supple.  Lymphadenopathy:     Cervical: No cervical adenopathy.  Skin:    General: Skin is warm and dry.     Findings: No rash.  Neurological:     Mental Status: He is alert and oriented to person, place, and time.     Gait: Gait normal.  Psychiatric:        Mood and Affect: Mood normal.        Behavior: Behavior normal.        Thought Content: Thought content normal.        Judgment: Judgment normal.    UC Treatments / Results  Labs (all labs ordered are listed, but only abnormal results are displayed) Labs Reviewed - No data to display  EKG   Radiology No results found.  Procedures Procedures  (including critical care time)  Medications Ordered in UC Medications  albuterol (PROVENTIL) (2.5 MG/3ML) 0.083% nebulizer solution 2.5 mg (has no administration in time range)    Initial Impression / Assessment and Plan / UC Course  I have reviewed the triage vital signs and the nursing notes. Acute bronchitis He was given an albuterol neb treatment here and his wheezing resolved on R lung completely, and still have mild on LUL I placed him on Zpack and Albuterol inhaler as noted.      Final Clinical Impressions(s) / UC Diagnoses   Final diagnoses:  None   Discharge Instructions   None    ED Prescriptions   None    PDMP not reviewed this encounter.   Garey Ham, [place] 10/20/21 1047

## 2022-01-09 ENCOUNTER — Encounter: Payer: Self-pay | Admitting: Emergency Medicine

## 2022-01-09 ENCOUNTER — Ambulatory Visit
Admission: EM | Admit: 2022-01-09 | Discharge: 2022-01-09 | Disposition: A | Discharge status: Home / Self Care | Payer: Federal, State, Local not specified - PPO | Attending: Emergency Medicine | Admitting: Emergency Medicine

## 2022-01-09 DIAGNOSIS — S70361A Insect bite (nonvenomous), right thigh, initial encounter: Secondary | ICD-10-CM | POA: Diagnosis not present

## 2022-01-09 DIAGNOSIS — W57XXXA Bitten or stung by nonvenomous insect and other nonvenomous arthropods, initial encounter: Secondary | ICD-10-CM | POA: Diagnosis not present

## 2022-01-09 DIAGNOSIS — R030 Elevated blood-pressure reading, without diagnosis of hypertension: Secondary | ICD-10-CM | POA: Diagnosis not present

## 2022-01-09 DIAGNOSIS — L03115 Cellulitis of right lower limb: Secondary | ICD-10-CM | POA: Diagnosis not present

## 2022-01-09 MED ORDER — CEPHALEXIN 500 MG PO CAPS: 500.0000 mg | ORAL_CAPSULE | Freq: Four times a day (QID) | ORAL | 0 refills | Status: DC

## 2022-01-09 NOTE — ED Provider Notes (Signed)
John Bowers    CSN: [id number] Arrival date & time: 01/09/22  1743      History   Chief Complaint Chief Complaint  Patient presents with   Insect Bite    HPI John Bowers is a 51 y.o. male.  Patient presents with insect bite on his right thigh since yesterday.  He did not see what insect bit or stung him.  He was inside at the time and felt a pain in the area and then noticed the bite.  The redness around the area is spreading.  It is painful.  No drainage.  No fever, chills, or other symptoms.  No treatment at home.  No pertinent medical history.    The history is provided by the patient.   History reviewed. No pertinent past medical history.  There are no problems to display for this patient.   History reviewed. No pertinent surgical history.     Home Medications    Prior to Admission medications   Medication Sig Start Date End Date Taking? Authorizing Provider  cephALEXin (KEFLEX) 500 MG capsule Take 1 capsule (500 mg total) by mouth 4 (four) times daily. 01/09/22  Yes Sharion Balloon, NP  albuterol (VENTOLIN HFA) 108 (90 Base) MCG/ACT inhaler Inhale 2 puffs into the lungs every 4 (four) hours as needed for wheezing or shortness of breath. 10/20/21   Rodriguez-Southworth, Sunday Spillers, PA-C    Family History History reviewed. No pertinent family history.  Social History Social History   Tobacco Use   Smoking status: Never  Substance Use Topics   Alcohol use: No   Drug use: Never     Allergies   Patient has no known allergies.   Review of Systems Review of Systems  Constitutional:  Negative for chills and fever.  Musculoskeletal:  Negative for arthralgias and joint swelling.  Skin:  Positive for color change and wound.  Neurological:  Negative for weakness and numbness.  All other systems reviewed and are negative.   Physical Exam Triage Vital Signs ED Triage Vitals  Enc Vitals Group     BP 01/09/22 1756 (!) 151/104     Pulse Rate 01/09/22 1756  66     Resp 01/09/22 1756 18     Temp 01/09/22 1756 98.1 F (36.7 C)     Temp src --      SpO2 01/09/22 1756 99 %     Weight --      Height --      Head Circumference --      Peak Flow --      Pain Score 01/09/22 1757 3     Pain Loc --      Pain Edu? --      Excl. in [place]? --    No data found.  Updated Vital Signs BP 130/88 (BP Location: Left Arm)   Pulse 66   Temp 98.1 F (36.7 C)   Resp 18   SpO2 99%   Visual Acuity Right Eye Distance:   Left Eye Distance:   Bilateral Distance:    Right Eye Near:   Left Eye Near:    Bilateral Near:     Physical Exam Vitals and nursing note reviewed.  Constitutional:      General: He is not in acute distress.    Appearance: Normal appearance. He is well-developed. He is not ill-appearing.  HENT:     Mouth/Throat:     Mouth: Mucous membranes are moist.  Cardiovascular:     Rate  and Rhythm: Normal rate and regular rhythm.     Heart sounds: Normal heart sounds.  Pulmonary:     Effort: Pulmonary effort is normal. No respiratory distress.     Breath sounds: Normal breath sounds.  Musculoskeletal:        General: Normal range of motion.     Cervical back: Neck supple.  Skin:    General: Skin is warm and dry.     Capillary Refill: Capillary refill takes less than 2 seconds.     Findings: Erythema and lesion present.     Comments: Lesion on right thigh with surrounding erythema.  Patient marked circle around redness last night and then second circle today.  The erythema is spread beyond both circles.  See picture.    Neurological:     General: No focal deficit present.     Mental Status: He is alert and oriented to person, place, and time.     Sensory: No sensory deficit.     Motor: No weakness.     Gait: Gait normal.  Psychiatric:        Mood and Affect: Mood normal.        Behavior: Behavior normal.      UC Treatments / Results  Labs (all labs ordered are listed, but only abnormal results are displayed) Labs Reviewed -  No data to display  EKG   Radiology No results found.  Procedures Procedures (including critical care time)  Medications Ordered in UC Medications - No data to display  Initial Impression / Assessment and Plan / UC Course  I have reviewed the triage vital signs and the nursing notes.  Pertinent labs & imaging results that were available during my care of the patient were reviewed by me and considered in my medical decision making (see chart for details).    Cellulitis of right thigh, insect bite of right thigh. Elevated blood pressure reading.  Treating with Keflex.  ED precautions discussed.  Education provided regarding cellulitis and insect bites.  Also discussed with patient that his blood pressure is elevated today and needs to be rechecked by his PCP in 2 to 4 weeks.  He agrees to plan of care.  Final Clinical Impressions(s) / UC Diagnoses   Final diagnoses:  Cellulitis of right thigh  Insect bite of right thigh, initial encounter  Elevated blood pressure reading     Discharge Instructions      Take the antibiotic as directed.  Go to the emergency department if you have symptoms of worsening infection such as increased redness, red streaks, fever, or other concerning symptoms.   Your blood pressure is elevated today at 151/104; recheck 130/88.  Please have this rechecked by your primary care provider in 2-4 weeks.          ED Prescriptions     Medication Sig Dispense Auth. Provider   cephALEXin (KEFLEX) 500 MG capsule Take 1 capsule (500 mg total) by mouth 4 (four) times daily. 28 capsule Sharion Balloon, NP      PDMP not reviewed this encounter.   Sharion Balloon, NP 01/09/22 1819

## 2022-01-09 NOTE — Discharge Instructions (Addendum)
Take the antibiotic as directed.  Go to the emergency department if you have symptoms of worsening infection such as increased redness, red streaks, fever, or other concerning symptoms.   Your blood pressure is elevated today at 151/104; recheck 130/88.  Please have this rechecked by your primary care provider in 2-4 weeks.

## 2022-01-09 NOTE — ED Triage Notes (Signed)
Patient c/o Insect Bite on RT upper thigh that happened yesterday.   Patient endorses increased swelling and redness.   Patient denies itchiness to site.   Patient hasn't used any medications for symptoms.

## 2022-04-01 DIAGNOSIS — Z0000 Encounter for general adult medical examination without abnormal findings: Secondary | ICD-10-CM | POA: Diagnosis not present

## 2022-04-01 DIAGNOSIS — E559 Vitamin D deficiency, unspecified: Secondary | ICD-10-CM | POA: Diagnosis not present

## 2022-04-01 DIAGNOSIS — Z13220 Encounter for screening for lipoid disorders: Secondary | ICD-10-CM | POA: Diagnosis not present

## 2022-04-08 DIAGNOSIS — Z0000 Encounter for general adult medical examination without abnormal findings: Secondary | ICD-10-CM | POA: Diagnosis not present

## 2022-05-05 DIAGNOSIS — G4719 Other hypersomnia: Secondary | ICD-10-CM | POA: Diagnosis not present

## 2022-05-05 DIAGNOSIS — E663 Overweight: Secondary | ICD-10-CM | POA: Diagnosis not present

## 2022-05-05 DIAGNOSIS — R030 Elevated blood-pressure reading, without diagnosis of hypertension: Secondary | ICD-10-CM | POA: Diagnosis not present

## 2022-06-15 DIAGNOSIS — G4733 Obstructive sleep apnea (adult) (pediatric): Secondary | ICD-10-CM | POA: Diagnosis not present

## 2022-06-18 DIAGNOSIS — G4733 Obstructive sleep apnea (adult) (pediatric): Secondary | ICD-10-CM | POA: Diagnosis not present

## 2022-07-10 DIAGNOSIS — G4733 Obstructive sleep apnea (adult) (pediatric): Secondary | ICD-10-CM | POA: Diagnosis not present

## 2022-08-09 DIAGNOSIS — G4733 Obstructive sleep apnea (adult) (pediatric): Secondary | ICD-10-CM | POA: Diagnosis not present

## 2022-09-09 DIAGNOSIS — G4733 Obstructive sleep apnea (adult) (pediatric): Secondary | ICD-10-CM | POA: Diagnosis not present

## 2022-09-15 DIAGNOSIS — G4733 Obstructive sleep apnea (adult) (pediatric): Secondary | ICD-10-CM | POA: Diagnosis not present

## 2022-10-10 DIAGNOSIS — G4733 Obstructive sleep apnea (adult) (pediatric): Secondary | ICD-10-CM | POA: Diagnosis not present

## 2022-10-12 DIAGNOSIS — G4733 Obstructive sleep apnea (adult) (pediatric): Secondary | ICD-10-CM | POA: Diagnosis not present

## 2022-11-08 DIAGNOSIS — G4733 Obstructive sleep apnea (adult) (pediatric): Secondary | ICD-10-CM | POA: Diagnosis not present

## 2022-12-09 DIAGNOSIS — G4733 Obstructive sleep apnea (adult) (pediatric): Secondary | ICD-10-CM | POA: Diagnosis not present

## 2022-12-31 DIAGNOSIS — S83411A Sprain of medial collateral ligament of right knee, initial encounter: Secondary | ICD-10-CM | POA: Diagnosis not present

## 2023-01-08 DIAGNOSIS — G4733 Obstructive sleep apnea (adult) (pediatric): Secondary | ICD-10-CM | POA: Diagnosis not present

## 2023-01-13 DIAGNOSIS — S83411A Sprain of medial collateral ligament of right knee, initial encounter: Secondary | ICD-10-CM | POA: Diagnosis not present

## 2023-01-14 DIAGNOSIS — S83411D Sprain of medial collateral ligament of right knee, subsequent encounter: Secondary | ICD-10-CM | POA: Diagnosis not present

## 2023-01-19 DIAGNOSIS — S83411D Sprain of medial collateral ligament of right knee, subsequent encounter: Secondary | ICD-10-CM | POA: Diagnosis not present

## 2023-02-08 DIAGNOSIS — G4733 Obstructive sleep apnea (adult) (pediatric): Secondary | ICD-10-CM | POA: Diagnosis not present

## 2023-02-09 DIAGNOSIS — S83411D Sprain of medial collateral ligament of right knee, subsequent encounter: Secondary | ICD-10-CM | POA: Diagnosis not present

## 2023-02-16 DIAGNOSIS — S83411D Sprain of medial collateral ligament of right knee, subsequent encounter: Secondary | ICD-10-CM | POA: Diagnosis not present

## 2023-03-10 DIAGNOSIS — G4733 Obstructive sleep apnea (adult) (pediatric): Secondary | ICD-10-CM | POA: Diagnosis not present

## 2023-04-10 DIAGNOSIS — G4733 Obstructive sleep apnea (adult) (pediatric): Secondary | ICD-10-CM | POA: Diagnosis not present

## 2023-05-22 ENCOUNTER — Ambulatory Visit
Admission: EM | Admit: 2023-05-22 | Discharge: 2023-05-22 | Disposition: A | Discharge status: Home / Self Care | Payer: Federal, State, Local not specified - PPO | Attending: Emergency Medicine | Admitting: Emergency Medicine

## 2023-05-22 DIAGNOSIS — R21 Rash and other nonspecific skin eruption: Secondary | ICD-10-CM

## 2023-05-22 MED ORDER — PREDNISONE 10 MG (21) PO TBPK: ORAL_TABLET | Freq: Every day | ORAL | 0 refills | Status: DC

## 2023-05-22 MED ORDER — DEXAMETHASONE SODIUM PHOSPHATE 10 MG/ML IJ SOLN
10.0000 mg | Freq: Once | INTRAMUSCULAR | Status: AC
  Administered 2023-05-22: 10 mg via INTRAMUSCULAR

## 2023-05-22 NOTE — ED Notes (Signed)
Provider triaged.  

## 2023-05-22 NOTE — ED Provider Notes (Signed)
[name]    CSN: [id number] Arrival date & time: 05/22/23  1313      History   Chief Complaint No chief complaint on file.   HPI John Bowers is a 52 y.o. male.   Patient presents for evaluation of a rash to the right arm present for 10 days.  Initially started off as 2 small red bumps then became a circular rash with lip swelling.  Rash is erythematous and pruritic.  Has attempted use of cortisone cream which has been ineffective.  Denies presence of drainage or fever.  Has not occurred before.  Denies changes in toiletries, diet or recent travel.  No other member of household or close contact has similar symptoms.  No past medical history on file.  There are no problems to display for this patient.   No past surgical history on file.     Home Medications    Prior to Admission medications   Medication Sig Start Date End Date Taking? Authorizing Provider  predniSONE (STERAPRED UNI-PAK 21 TAB) 10 MG (21) TBPK tablet Take by mouth daily. Take 6 tabs by mouth daily  for 2 days, then 5 tabs for 2 days, then 4 tabs for 2 days, then 3 tabs for 2 days, 2 tabs for 2 days, then 1 tab by mouth daily for 2 days 05/22/23  Yes Drury Ardizzone R, NP  albuterol (VENTOLIN HFA) 108 (90 Base) MCG/ACT inhaler Inhale 2 puffs into the lungs every 4 (four) hours as needed for wheezing or shortness of breath. 10/20/21   Rodriguez-Southworth, Nettie Elm, PA-C  cephALEXin (KEFLEX) 500 MG capsule Take 1 capsule (500 mg total) by mouth 4 (four) times daily. 01/09/22   Mickie Bail, NP    Family History No family history on file.  Social History Social History   Tobacco Use   Smoking status: Never  Substance Use Topics   Alcohol use: No   Drug use: Never     Allergies   Patient has no known allergies.   Review of Systems Review of Systems  Constitutional: Negative.   Respiratory: Negative.    Cardiovascular: Negative.   Skin:  Positive for rash. Negative for color change,  pallor and wound.     Physical Exam Triage Vital Signs ED Triage Vitals  Encounter Vitals Group     BP      Systolic BP Percentile      Diastolic BP Percentile      Pulse      Resp      Temp      Temp src      SpO2      Weight      Height      Head Circumference      Peak Flow      Pain Score      Pain Loc      Pain Education      Exclude from Growth Chart    No data found.  Updated Vital Signs There were no vitals taken for this visit.  Visual Acuity Right Eye Distance:   Left Eye Distance:   Bilateral Distance:    Right Eye Near:   Left Eye Near:    Bilateral Near:     Physical Exam Constitutional:      Appearance: Normal appearance.  Eyes:     Extraocular Movements: Extraocular movements intact.  Pulmonary:     Effort: Pulmonary effort is normal.  Skin:    Comments: Erythematous circular macular rash  present to the flexor of the right arm, mild to moderate swelling, nontender, nondraining  Neurological:     Mental Status: He is alert and oriented to person, place, and time. Mental status is at baseline.      UC Treatments / Results  Labs (all labs ordered are listed, but only abnormal results are displayed) Labs Reviewed - No data to display  EKG   Radiology No results found.  Procedures Procedures (including critical care time)  Medications Ordered in UC Medications  dexamethasone (DECADRON) injection 10 mg (has no administration in time range)    Initial Impression / Assessment and Plan / UC Course  I have reviewed the triage vital signs and the nursing notes.  Pertinent labs & imaging results that were available during my care of the patient were reviewed by me and considered in my medical decision making (see chart for details).  Rash  Most consistent with a contact dermatitis, no signs of infection at this time, discussed with patient, Decadron injection given prior to discharge and prescribed prednisone taper for outpatient use,  recommended oral or topical antihistamines or calamine lotion for management of pruritus, advised against long exposure to heat to prevent further irritation, given precautions to follow-up with urgent care if symptoms continue to persist or worsen Final Clinical Impressions(s) / UC Diagnoses   Final diagnoses:  Rash     Discharge Instructions      Today you are being treated for the rash which appears to be inflammatory, at this time is no signs of infection such as drainage or fever  You have been given an injection of steroids today in the office today to help reduce the inflammatory process that occurs with this rash which will help minimize your itching as well as begin to clear  Starting tomorrow take prednisone every morning with food as directed, to continue the above process  You may continue use of topical calamine or Benadryl cream to help manage itching, you may also continue oral Benadryl  Please avoid long exposures to heat such as a hot steamy shower or being outside as this may cause further irritation to your rash  You may follow-up with his urgent care as needed if symptoms persist or worsen    ED Prescriptions     Medication Sig Dispense Auth. Provider   predniSONE (STERAPRED UNI-PAK 21 TAB) 10 MG (21) TBPK tablet Take by mouth daily. Take 6 tabs by mouth daily  for 2 days, then 5 tabs for 2 days, then 4 tabs for 2 days, then 3 tabs for 2 days, 2 tabs for 2 days, then 1 tab by mouth daily for 2 days 42 tablet Codi Kertz, Elita Boone, NP      PDMP not reviewed this encounter.   Valinda Hoar, NP 05/22/23 1342

## 2023-05-22 NOTE — Discharge Instructions (Signed)
Today you are being treated for the rash which appears to be inflammatory, at this time is no signs of infection such as drainage or fever  You have been given an injection of steroids today in the office today to help reduce the inflammatory process that occurs with this rash which will help minimize your itching as well as begin to clear  Starting tomorrow take prednisone every morning with food as directed, to continue the above process  You may continue use of topical calamine or Benadryl cream to help manage itching, you may also continue oral Benadryl  Please avoid long exposures to heat such as a hot steamy shower or being outside as this may cause further irritation to your rash  You may follow-up with his urgent care as needed if symptoms persist or worsen

## 2023-06-13 ENCOUNTER — Encounter (HOSPITAL_COMMUNITY): Payer: Self-pay

## 2023-06-13 ENCOUNTER — Emergency Department (HOSPITAL_COMMUNITY): Payer: Federal, State, Local not specified - PPO

## 2023-06-13 ENCOUNTER — Other Ambulatory Visit: Payer: Self-pay

## 2023-06-13 ENCOUNTER — Emergency Department (HOSPITAL_COMMUNITY)
Admission: EM | Admit: 2023-06-13 | Discharge: 2023-06-13 | Disposition: A | Discharge status: Home / Self Care | Payer: Federal, State, Local not specified - PPO | Attending: Emergency Medicine | Admitting: Emergency Medicine

## 2023-06-13 DIAGNOSIS — Z79899 Other long term (current) drug therapy: Secondary | ICD-10-CM | POA: Diagnosis not present

## 2023-06-13 DIAGNOSIS — R0789 Other chest pain: Secondary | ICD-10-CM | POA: Diagnosis not present

## 2023-06-13 DIAGNOSIS — I10 Essential (primary) hypertension: Secondary | ICD-10-CM | POA: Diagnosis not present

## 2023-06-13 DIAGNOSIS — R079 Chest pain, unspecified: Secondary | ICD-10-CM | POA: Diagnosis not present

## 2023-06-13 LAB — CBC WITH DIFFERENTIAL/PLATELET
Abs Immature Granulocytes: 0.02 10*3/uL (ref 0.00–0.07)
Basophils Absolute: 0.1 10*3/uL (ref 0.0–0.1)
Basophils Relative: 1 %
Eosinophils Absolute: 0.2 10*3/uL (ref 0.0–0.5)
Eosinophils Relative: 4 %
HCT: 45.9 % (ref 39.0–52.0)
Hemoglobin: 14.9 g/dL (ref 13.0–17.0)
Immature Granulocytes: 0 %
Lymphocytes Relative: 33 %
Lymphs Abs: 1.8 10*3/uL (ref 0.7–4.0)
MCH: 27.6 pg (ref 26.0–34.0)
MCHC: 32.5 g/dL (ref 30.0–36.0)
MCV: 85 fL (ref 80.0–100.0)
Monocytes Absolute: 0.3 10*3/uL (ref 0.1–1.0)
Monocytes Relative: 6 %
Neutro Abs: 2.9 10*3/uL (ref 1.7–7.7)
Neutrophils Relative %: 56 %
Platelets: 245 10*3/uL (ref 150–400)
RBC: 5.4 MIL/uL (ref 4.22–5.81)
RDW: 12.8 % (ref 11.5–15.5)
WBC: 5.3 10*3/uL (ref 4.0–10.5)
nRBC: 0 % (ref 0.0–0.2)

## 2023-06-13 LAB — COMPREHENSIVE METABOLIC PANEL
ALT: 27 U/L (ref 0–44)
AST: 21 U/L (ref 15–41)
Albumin: 3.9 g/dL (ref 3.5–5.0)
Alkaline Phosphatase: 80 U/L (ref 38–126)
Anion gap: 9 (ref 5–15)
BUN: 12 mg/dL (ref 6–20)
CO2: 26 mmol/L (ref 22–32)
Calcium: 9.6 mg/dL (ref 8.9–10.3)
Chloride: 104 mmol/L (ref 98–111)
Creatinine, Ser: 1.2 mg/dL (ref 0.61–1.24)
GFR, Estimated: >60 mL/min (ref >60)
Glucose, Bld: 133 mg/dL — ABNORMAL HIGH (ref 70–99)
Potassium: 3.9 mmol/L (ref 3.5–5.1)
Sodium: 139 mmol/L (ref 135–145)
Total Bilirubin: 0.7 mg/dL (ref 0.3–1.2)
Total Protein: 7.1 g/dL (ref 6.5–8.1)

## 2023-06-13 LAB — TROPONIN I (HIGH SENSITIVITY): Troponin I (High Sensitivity): 3 ng/L (ref <18)

## 2023-06-13 MED ORDER — HYDROCHLOROTHIAZIDE 12.5 MG PO TABS
12.5000 mg | ORAL_TABLET | Freq: Every day | ORAL | Status: DC
  Administered 2023-06-13: 12.5 mg via ORAL
  Filled 2023-06-13: qty 1

## 2023-06-13 MED ORDER — HYDROCHLOROTHIAZIDE 12.5 MG PO TABS: 12.5000 mg | ORAL_TABLET | Freq: Every day | ORAL | 0 refills | Status: AC

## 2023-06-13 NOTE — ED Provider Notes (Signed)
EMERGENCY DEPARTMENT AT [facility] Provider Note   CSN: [id number] Arrival date & time: 06/13/23  1256     History  Chief Complaint  Patient presents with   Hypertension   HPI John Bowers is a 52 y.o. 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  States he is generally not felt well in the past week and reports some chest tightness in the center of his chest it has been going on for about a couple weeks. States he felt the chest tightness this morning which she stated felt slightly worse than usual which prompted evaluation today.  Chest pain is nonradiating in center chest and felt like a dull pain.  States it lasted for about an hour and has completely resolved and now chest pain-free.  No associated shortness of breath.  States she has had a headache intermittently the last couple weeks but no headache today.  Denies visual disturbance.  States he checked his pressure the morning found it to be in the 170s.  HPI    Home Medications Prior to Admission medications   Medication Sig Start Date End Date Taking? Authorizing Provider  albuterol (VENTOLIN HFA) 108 (90 Base) MCG/ACT inhaler Inhale 2 puffs into the lungs every 4 (four) hours as needed for wheezing or shortness of breath. 10/20/21  Yes Rodriguez-Southworth, Nettie Elm, PA-C  hydrochlorothiazide (HYDRODIURIL) 12.5 MG tablet Take 1 tablet (12.5 mg total) by mouth daily. 06/13/23 07/13/23 Yes Gareth Eagle, PA-C  ibuprofen (ADVIL) 200 MG tablet Take 800 mg by mouth every 6 (six) hours as needed for mild pain (pain score 1-3) or moderate pain (pain score 4-6).   Yes [provider]  metroNIDAZOLE (METROCREAM) 0.75 % cream Apply 1 Application topically 2 (two) times daily as needed (rosacea).   Yes [provider]  rosuvastatin (CRESTOR) 20 MG tablet Take 20 mg by mouth daily. 07/14/18  Yes [provider]      Allergies    Patient has no  known allergies.    Review of Systems   See HPI for pertinent positives  Physical Exam Updated Vital Signs BP (!) 159/108 (BP Location: Right Arm)   Pulse 70   Temp 97.6 F (36.4 C) (Oral)   Resp 18   Ht 5\' 10"  (1.778 m)   Wt 90.7 kg   SpO2 100%   BMI 28.70 kg/m  Physical Exam Vitals and nursing note reviewed.  HENT:     Head: Normocephalic and atraumatic.     Mouth/Throat:     Mouth: Mucous membranes are moist.  Eyes:     General:        Right eye: No discharge.        Left eye: No discharge.     Conjunctiva/sclera: Conjunctivae normal.  Cardiovascular:     Rate and Rhythm: Normal rate and regular rhythm.     Pulses: Normal pulses.     Heart sounds: Normal heart sounds.  Pulmonary:     Effort: Pulmonary effort is normal.     Breath sounds: Normal breath sounds.  Abdominal:     General: Abdomen is flat.     Palpations: Abdomen is soft.  Skin:    General: Skin is warm and dry.  Neurological:     General: No focal deficit present.     Comments: GCS 15. Speech is goal oriented. No deficits appreciated to CN III-XII; symmetric eyebrow raise, no facial drooping, tongue midline. Patient has equal grip strength  bilaterally with 5/5 strength against resistance in all major muscle groups bilaterally. Sensation to light touch intact. Patient moves extremities without ataxia. Normal finger-nose-finger. Patient ambulatory with steady gait.  Psychiatric:        Mood and Affect: Mood normal.     ED Results / Procedures / Treatments   Labs (all labs ordered are listed, but only abnormal results are displayed) Labs Reviewed  COMPREHENSIVE METABOLIC PANEL - Abnormal; Notable for the following components:      Result Value   Glucose, Bld 133 (*)    All other components within normal limits  CBC WITH DIFFERENTIAL/PLATELET  TROPONIN I (HIGH SENSITIVITY)    EKG EKG Interpretation Date/Time:  Sunday June 13 2023 13:17:56 EDT Ventricular Rate:  65 PR Interval:  156 QRS  Duration:  94 QT Interval:  390 QTC Calculation: 405 R Axis:   88  Text Interpretation: Normal sinus rhythm Normal ECG No previous ECGs available Confirmed by Kingsley, Victoria ([id number]) on 06/13/2023 2:07:28 PM  Radiology DG Chest 2 View  Result Date: 06/13/2023 CLINICAL DATA:  Chest pain. EXAM: CHEST - 2 VIEW COMPARISON:  None Available. FINDINGS: Bilateral lung fields are clear. Bilateral costophrenic angles are clear. Normal cardio-mediastinal silhouette. No acute osseous abnormalities. The soft tissues are within normal limits. IMPRESSION: No active cardiopulmonary disease. Electronically Signed   By: Nisarg  Parikh M.D.   On: 06/13/2023 13:59    Procedures Procedures    Medications Ordered in ED Medications  hydrochlorothiazide (HYDRODIURIL) tablet 12.5 mg (12.5 mg Oral Given 06/13/23 1511)    ED Course/ Medical Decision Making/ A&P                                 Medical Decision Making  51 year old well-appearing male presenting for elevated blood pressure.  Exam was unremarkable.  DDx includes hypertensive emergency, ACS, pulmonary edema, renal injury, stroke, other.  Considered stroke but unlikely given no focal neuro deficits.  Workup also not suggestive of hypertensive emergency.  ACS is unlikely given reassuring workup and no chest pain at this time.  X-ray not suggestive pulmonary edema.  No acute lab derangements.  I personally interpreted EKG which revealed normal sinus rhythm.  Suspect this is hypertension.  Treated with hydrochlorothiazide.  Advised follow-up with PCP.  Discussed return precautions.  Vital stable.  Discharged home in good condition.        Final Clinical Impression(s) / ED Diagnoses Final diagnoses:  Hypertension, unspecified type    Rx / DC Orders ED Discharge Orders          Ordered    hydrochlorothiazide (HYDRODIURIL) 12.5 MG tablet  Daily        10 /20/24 1504              Gareth Eagle, PA-C 06/13/23 1516    Elayne Snare K, DO 06/13/23 1541

## 2023-06-13 NOTE — Discharge Instructions (Signed)
Evaluation today was overall reassuring.  Starting on hydrochlorothiazide for your high blood pressure.  If you develop worsening chest pain, shortness of breath, urinary changes, headache with visual disturbances or any other concerning symptom please return emerged part further evaluation.  Otherwise recommend you follow-up with your PCP.

## 2023-06-13 NOTE — ED Triage Notes (Signed)
Patient complains of not feeling well the past week. Today checked his pressure and found to be in 170s. Alert and oriented, also concerned may be anxiety. Having some chest tightness with same

## 2023-06-13 NOTE — ED Provider Triage Note (Signed)
Emergency Medicine Provider Triage Evaluation Note  John Bowers , a 52 y.o. male  was evaluated in triage.  Pt complains of increased stress, feels tight in chestx 1 week. Reports chest pain this AM, started at 7:30AM, substernal, 3/10. Reports took BP 170/100. Reports SOB. No pain with inspiration.  Hx of OSA and HLD.   Review of Systems  Positive: Chest pain Negative: N/V  Physical Exam  BP (!) 154/110   Pulse 80   Temp 97.6 F (36.4 C) (Oral)   Resp 16   SpO2 100%  Gen:   Awake, no distress  Resp:  Normal effort  MSK:   Moves extremities without difficulty  Other:    Medical Decision Making  Medically screening exam initiated at 1:06 PM.  Appropriate orders placed.  John Bowers was informed that the remainder of the evaluation will be completed by another provider, this initial triage assessment does not replace that evaluation, and the importance of remaining in the ED until their evaluation is complete.    John Bowers, [place] 06/13/23 1308

## 2023-06-30 DIAGNOSIS — L309 Dermatitis, unspecified: Secondary | ICD-10-CM | POA: Diagnosis not present

## 2023-06-30 DIAGNOSIS — Z23 Encounter for immunization: Secondary | ICD-10-CM | POA: Diagnosis not present

## 2023-06-30 DIAGNOSIS — I10 Essential (primary) hypertension: Secondary | ICD-10-CM | POA: Diagnosis not present

## 2023-06-30 DIAGNOSIS — M6790 Unspecified disorder of synovium and tendon, unspecified site: Secondary | ICD-10-CM | POA: Diagnosis not present

## 2023-06-30 DIAGNOSIS — Z6829 Body mass index (BMI) 29.0-29.9, adult: Secondary | ICD-10-CM | POA: Diagnosis not present

## 2023-07-28 DIAGNOSIS — I10 Essential (primary) hypertension: Secondary | ICD-10-CM | POA: Diagnosis not present

## 2023-09-08 DIAGNOSIS — G4733 Obstructive sleep apnea (adult) (pediatric): Secondary | ICD-10-CM | POA: Diagnosis not present

## 2023-09-08 DIAGNOSIS — Z13220 Encounter for screening for lipoid disorders: Secondary | ICD-10-CM | POA: Diagnosis not present

## 2023-09-08 DIAGNOSIS — L719 Rosacea, unspecified: Secondary | ICD-10-CM | POA: Diagnosis not present

## 2023-09-08 DIAGNOSIS — Z23 Encounter for immunization: Secondary | ICD-10-CM | POA: Diagnosis not present

## 2023-09-08 DIAGNOSIS — Z0000 Encounter for general adult medical examination without abnormal findings: Secondary | ICD-10-CM | POA: Diagnosis not present

## 2023-09-08 DIAGNOSIS — Z125 Encounter for screening for malignant neoplasm of prostate: Secondary | ICD-10-CM | POA: Diagnosis not present

## 2023-09-08 DIAGNOSIS — E7800 Pure hypercholesterolemia, unspecified: Secondary | ICD-10-CM | POA: Diagnosis not present

## 2023-09-08 DIAGNOSIS — I10 Essential (primary) hypertension: Secondary | ICD-10-CM | POA: Diagnosis not present

## 2023-11-16 DIAGNOSIS — Z23 Encounter for immunization: Secondary | ICD-10-CM | POA: Diagnosis not present

## 2024-01-25 DIAGNOSIS — I10 Essential (primary) hypertension: Secondary | ICD-10-CM | POA: Diagnosis not present

## 2024-01-25 DIAGNOSIS — Z6830 Body mass index (BMI) 30.0-30.9, adult: Secondary | ICD-10-CM | POA: Diagnosis not present

## 2024-01-25 DIAGNOSIS — G4733 Obstructive sleep apnea (adult) (pediatric): Secondary | ICD-10-CM | POA: Diagnosis not present

## 2024-01-25 DIAGNOSIS — E7800 Pure hypercholesterolemia, unspecified: Secondary | ICD-10-CM | POA: Diagnosis not present
